# Patient Record
Sex: Female | Born: 1970 | Race: White | Hispanic: No | Marital: Married | Smoking: Never smoker
Health system: Southern US, Community
[De-identification: ages and names within clinical notes are randomized; demographics above are authoritative.]

## PROBLEM LIST (undated history)

## (undated) DIAGNOSIS — N3090 Cystitis, unspecified without hematuria: Secondary | ICD-10-CM

## (undated) DIAGNOSIS — T753XXA Motion sickness, initial encounter: Secondary | ICD-10-CM

## (undated) DIAGNOSIS — R87629 Unspecified abnormal cytological findings in specimens from vagina: Secondary | ICD-10-CM

## (undated) DIAGNOSIS — M1990 Unspecified osteoarthritis, unspecified site: Secondary | ICD-10-CM

## (undated) DIAGNOSIS — I8393 Asymptomatic varicose veins of bilateral lower extremities: Secondary | ICD-10-CM

## (undated) DIAGNOSIS — N809 Endometriosis, unspecified: Secondary | ICD-10-CM

## (undated) DIAGNOSIS — Z8489 Family history of other specified conditions: Secondary | ICD-10-CM

## (undated) HISTORY — DX: Unspecified abnormal cytological findings in specimens from vagina: R87.629

## (undated) HISTORY — DX: Cystitis, unspecified without hematuria: N30.90

## (undated) HISTORY — PX: DILATION AND CURETTAGE OF UTERUS: SHX78

## (undated) HISTORY — DX: Endometriosis, unspecified: N80.9

---

## 2008-05-26 ENCOUNTER — Ambulatory Visit: Payer: Self-pay | Admitting: Unknown Physician Specialty

## 2010-10-02 ENCOUNTER — Ambulatory Visit: Payer: Self-pay | Admitting: Obstetrics & Gynecology

## 2010-10-16 ENCOUNTER — Ambulatory Visit: Payer: Self-pay | Admitting: Obstetrics & Gynecology

## 2010-10-23 ENCOUNTER — Ambulatory Visit: Payer: Self-pay | Admitting: Obstetrics & Gynecology

## 2014-01-21 LAB — HM PAP SMEAR: HM PAP: NEGATIVE

## 2016-03-25 ENCOUNTER — Other Ambulatory Visit: Payer: Self-pay | Admitting: Obstetrics & Gynecology

## 2016-03-25 DIAGNOSIS — Z1231 Encounter for screening mammogram for malignant neoplasm of breast: Secondary | ICD-10-CM

## 2016-04-21 ENCOUNTER — Ambulatory Visit
Admission: RE | Admit: 2016-04-21 | Discharge: 2016-04-21 | Disposition: A | Discharge status: Home / Self Care | Payer: 59 | Source: Ambulatory Visit | Attending: Obstetrics & Gynecology | Admitting: Obstetrics & Gynecology

## 2016-04-21 DIAGNOSIS — Z1231 Encounter for screening mammogram for malignant neoplasm of breast: Secondary | ICD-10-CM | POA: Insufficient documentation

## 2016-04-23 ENCOUNTER — Inpatient Hospital Stay
Admission: RE | Admit: 2016-04-23 | Discharge: 2016-04-23 | Disposition: A | Discharge status: Home / Self Care | Payer: Self-pay | Source: Ambulatory Visit | Attending: *Deleted | Admitting: *Deleted

## 2016-04-23 ENCOUNTER — Other Ambulatory Visit: Payer: Self-pay | Admitting: *Deleted

## 2016-04-23 DIAGNOSIS — Z9289 Personal history of other medical treatment: Secondary | ICD-10-CM

## 2016-05-08 ENCOUNTER — Other Ambulatory Visit: Payer: Self-pay | Admitting: Obstetrics & Gynecology

## 2016-05-08 DIAGNOSIS — R928 Other abnormal and inconclusive findings on diagnostic imaging of breast: Secondary | ICD-10-CM

## 2016-05-08 DIAGNOSIS — N6320 Unspecified lump in the left breast, unspecified quadrant: Secondary | ICD-10-CM

## 2016-05-14 ENCOUNTER — Ambulatory Visit
Admission: RE | Admit: 2016-05-14 | Discharge: 2016-05-14 | Disposition: A | Discharge status: Home / Self Care | Payer: 59 | Source: Ambulatory Visit | Attending: Obstetrics & Gynecology | Admitting: Obstetrics & Gynecology

## 2016-05-14 DIAGNOSIS — R928 Other abnormal and inconclusive findings on diagnostic imaging of breast: Secondary | ICD-10-CM | POA: Diagnosis not present

## 2016-05-14 DIAGNOSIS — N6320 Unspecified lump in the left breast, unspecified quadrant: Secondary | ICD-10-CM

## 2016-05-14 LAB — HM MAMMOGRAPHY

## 2017-03-19 ENCOUNTER — Encounter: Payer: Self-pay | Admitting: Obstetrics & Gynecology

## 2017-03-29 ENCOUNTER — Ambulatory Visit (INDEPENDENT_AMBULATORY_CARE_PROVIDER_SITE_OTHER): Payer: 59 | Admitting: Obstetrics & Gynecology

## 2017-03-29 ENCOUNTER — Encounter: Payer: Self-pay | Admitting: Obstetrics & Gynecology

## 2017-03-29 VITALS — BP 110/60 | HR 55 | Ht 64.0 in | Wt 126.0 lb

## 2017-03-29 DIAGNOSIS — N3010 Interstitial cystitis (chronic) without hematuria: Secondary | ICD-10-CM | POA: Diagnosis not present

## 2017-03-29 DIAGNOSIS — N951 Menopausal and female climacteric states: Secondary | ICD-10-CM | POA: Diagnosis not present

## 2017-03-29 DIAGNOSIS — Z01419 Encounter for gynecological examination (general) (routine) without abnormal findings: Secondary | ICD-10-CM | POA: Diagnosis not present

## 2017-03-29 DIAGNOSIS — Z1231 Encounter for screening mammogram for malignant neoplasm of breast: Secondary | ICD-10-CM

## 2017-03-29 DIAGNOSIS — Z1329 Encounter for screening for other suspected endocrine disorder: Secondary | ICD-10-CM

## 2017-03-29 DIAGNOSIS — Z124 Encounter for screening for malignant neoplasm of cervix: Secondary | ICD-10-CM

## 2017-03-29 DIAGNOSIS — Z1321 Encounter for screening for nutritional disorder: Secondary | ICD-10-CM

## 2017-03-29 DIAGNOSIS — Z803 Family history of malignant neoplasm of breast: Secondary | ICD-10-CM

## 2017-03-29 DIAGNOSIS — Z0000 Encounter for general adult medical examination without abnormal findings: Secondary | ICD-10-CM

## 2017-03-29 DIAGNOSIS — Z131 Encounter for screening for diabetes mellitus: Secondary | ICD-10-CM

## 2017-03-29 DIAGNOSIS — Z13220 Encounter for screening for lipoid disorders: Secondary | ICD-10-CM

## 2017-03-29 DIAGNOSIS — Z1239 Encounter for other screening for malignant neoplasm of breast: Secondary | ICD-10-CM

## 2017-03-29 NOTE — Progress Notes (Signed)
HPI:      [name] is a 46 y.o. [id number] who LMP was Patient's last menstrual period was 03/13/2017., she presents today for her annual examination. The patient has no complaints today. The patient is sexually active. Her last pap: approximate date 2015 and was normal and last mammogram: approximate date 2017 and was normal. The patient does perform self breast exams.  There is notable family history of breast or ovarian cancer in her family.  The patient has regular exercise: yes.  The patient denies current symptoms of depression.   Vag dryness at times; also occas hotflases Concerned about 10 lb weight loss this year Periods reg, light Mood changes- hypersensitivity- worse this year FH Breast Ca Aunts ages 7555 and 5260 (maternal)  GYN History: Contraception: vasectomy  PMHx: Past Medical History:  Diagnosis Date  . Abnormal Pap smear of vagina   . Cystitis   . Endometriosis    Past Surgical History:  Procedure Laterality Date  . DILATION AND CURETTAGE OF UTERUS     Family History  Problem Relation Age of Onset  . Breast cancer Maternal Aunt        x2. late 3960's and early 70's  . Fibromyalgia Mother   . Heart disease Maternal Grandmother   . Heart disease Maternal Grandfather   . Heart disease Paternal Grandmother   . Heart disease Paternal Grandfather   . Brain cancer Maternal Uncle    Social History   Tobacco Use  . Smoking status: Never Smoker  . Smokeless tobacco: Never Used  Substance Use Topics  . Alcohol use: No    Frequency: Never  . Drug use: No    Current Outpatient Medications:  .  aspirin EC 81 MG tablet, Take by mouth., Disp: , Rfl:  .  Multiple Vitamin (MULTI-VITAMINS) TABS, Take by mouth., Disp: , Rfl:  Allergies: Patient has no known allergies.  Review of Systems  Constitutional: Negative for chills, fever and malaise/fatigue.  HENT: Negative for congestion, sinus pain and sore throat.   Eyes: Negative for blurred vision and pain.    Respiratory: Negative for cough and wheezing.   Cardiovascular: Negative for chest pain and leg swelling.  Gastrointestinal: Negative for abdominal pain, constipation, diarrhea, heartburn, nausea and vomiting.  Genitourinary: Negative for dysuria, frequency, hematuria and urgency.  Musculoskeletal: Negative for back pain, joint pain, myalgias and neck pain.  Skin: Negative for itching and rash.  Neurological: Negative for dizziness, tremors and weakness.  Endo/Heme/Allergies: Does not bruise/bleed easily.  Psychiatric/Behavioral: Negative for depression. The patient is not nervous/anxious and does not have insomnia.     Objective: BP 110/60   Pulse (!) 55   Ht 5\' 4"  (1.626 m)   Wt 126 lb (57.2 kg)   LMP 03/13/2017   BMI 21.63 kg/m   Filed Weights   03/29/17 0808  Weight: 126 lb (57.2 kg)   Body mass index is 21.63 kg/m. Physical Exam  Constitutional: She is oriented to person, place, and time. She appears well-developed and well-nourished. No distress.  Genitourinary: Rectum normal, vagina normal and uterus normal. Pelvic exam was performed with patient supine. There is no rash or lesion on the right labia. There is no rash or lesion on the left labia. Vagina exhibits no lesion. No bleeding in the vagina. Right adnexum does not display mass and does not display tenderness. Left adnexum does not display mass and does not display tenderness. Cervix does not exhibit motion tenderness, lesion, friability or polyp.  Uterus is mobile and midaxial. Uterus is not enlarged or exhibiting a mass.  Genitourinary Comments: Multiglobular yet small uterus (fibroids)  HENT:  Head: Normocephalic and atraumatic. Head is without laceration.  Right Ear: Hearing normal.  Left Ear: Hearing normal.  Nose: No epistaxis.  No foreign bodies.  Mouth/Throat: Uvula is midline, oropharynx is clear and moist and mucous membranes are normal.  Eyes: Pupils are equal, round, and reactive to light.  Neck:  Normal range of motion. Neck supple. No thyromegaly present.  Cardiovascular: Normal rate and regular rhythm. Exam reveals no gallop and no friction rub.  No murmur heard. Pulmonary/Chest: Effort normal and breath sounds normal. No respiratory distress. She has no wheezes. Right breast exhibits no mass, no skin change and no tenderness. Left breast exhibits no mass, no skin change and no tenderness.  Abdominal: Soft. Bowel sounds are normal. She exhibits no distension. There is no tenderness. There is no rebound.  Musculoskeletal: Normal range of motion.  Neurological: She is alert and oriented to person, place, and time. No cranial nerve deficit.  Skin: Skin is warm and dry.  Psychiatric: She has a normal mood and affect. Judgment normal.  Vitals reviewed.   Assessment:  ANNUAL EXAM 1. Annual physical exam   2. Screening for cervical cancer   3. Screening for breast cancer   4. IC (interstitial cystitis)   5. Perimenopausal   6. Screening for cholesterol level   7. Screening for diabetes mellitus   8. Screening for thyroid disorder   9. Encounter for vitamin deficiency screening   10. Family history of breast cancer      Screening Plan:            1.  Cervical Screening-  Pap smear done today  2. Breast screening- Exam annually and mammogram>40 planned   3. Colonoscopy every 10 years, Hemoccult testing - after age 48  4. Labs Ordered today  5. Counseling for contraception: vasectomy  Other:  1. Annual physical exam  2. Screening for cervical cancer - IGP, Aptima HPV  3. Screening for breast cancer - MM DIGITAL SCREENING BILATERAL; Future  4. IC (interstitial cystitis) - CBC With Differential  5. Perimenopausal - CBC With Differential - FSH/LH - Estradiol - [facility] Breast and GYN Cancer  6. Screening for cholesterol level - Lipid panel  7. Screening for diabetes mellitus - Glucose, fasting  8. Screening for thyroid disorder - TSH  9. Encounter for  vitamin deficiency screening - VITAMIN D 25 Hydroxy (Vit-D Deficiency, Fractures)  10. Family history of breast cancer - MM DIGITAL SCREENING BILATERAL; Future    F/U  Return in about 1 year (around 03/29/2018) for Annual.  Annamarie MajorPaul Braddock Servellon, MD, Merlinda FrederickFACOG [facility], [facility] 03/29/2017  8:44 AM

## 2017-03-29 NOTE — Patient Instructions (Signed)
PAP every three years Mammogram every year    Call [phone] to [facility]

## 2017-04-01 LAB — IGP, APTIMA HPV
HPV Aptima: NEGATIVE
PAP Smear Comment: 0

## 2017-04-05 ENCOUNTER — Telehealth: Payer: Self-pay

## 2017-04-05 NOTE — Telephone Encounter (Signed)
Please advise if all normal

## 2017-04-05 NOTE — Telephone Encounter (Signed)
Pt inquiring about lab results from 03/29/17 visit w/[facility]. [phone]#[phone]

## 2017-04-06 ENCOUNTER — Encounter: Payer: Self-pay | Admitting: Obstetrics & Gynecology

## 2017-04-06 NOTE — Telephone Encounter (Signed)
Let her know PAP was normal then.

## 2017-04-06 NOTE — Telephone Encounter (Signed)
lmtrc

## 2017-04-14 NOTE — Telephone Encounter (Signed)
[facility] called and stated pt insurance will not cover the cancer testing, pt does not fit criteria, denial reference number is  [id number] [facility] states they will let the pt know also. Call back [phone] [facility] [phone]

## 2017-04-22 NOTE — Telephone Encounter (Signed)
Pt aware.

## 2017-04-22 NOTE — Telephone Encounter (Signed)
Pt returning call about the genetic DNA testing she had through [facility]. She states she got a call from our office. CB# [phone]

## 2017-04-23 LAB — CBC WITH DIFFERENTIAL
BASOS ABS: 0 10*3/uL (ref 0.0–0.2)
BASOS: 1 %
EOS (ABSOLUTE): 0.2 10*3/uL (ref 0.0–0.4)
Eos: 3 %
Hematocrit: 39.8 % (ref 34.0–46.6)
Hemoglobin: 12.5 g/dL (ref 11.1–15.9)
IMMATURE GRANS (ABS): 0 10*3/uL (ref 0.0–0.1)
IMMATURE GRANULOCYTES: 0 %
LYMPHS: 34 %
Lymphocytes Absolute: 1.9 10*3/uL (ref 0.7–3.1)
MCH: 28.2 pg (ref 26.6–33.0)
MCHC: 31.4 g/dL — ABNORMAL LOW (ref 31.5–35.7)
MCV: 90 fL (ref 79–97)
Monocytes Absolute: 0.3 10*3/uL (ref 0.1–0.9)
Monocytes: 6 %
NEUTROS PCT: 56 %
Neutrophils Absolute: 3.1 10*3/uL (ref 1.4–7.0)
RBC: 4.43 x10E6/uL (ref 3.77–5.28)
RDW: 13.5 % (ref 12.3–15.4)
WBC: 5.5 10*3/uL (ref 3.4–10.8)

## 2017-04-23 LAB — LIPID PANEL
CHOLESTEROL TOTAL: 171 mg/dL (ref 100–199)
Chol/HDL Ratio: 2.8 ratio (ref 0.0–4.4)
HDL: 61 mg/dL (ref >39)
LDL Calculated: 94 mg/dL (ref 0–99)
Triglycerides: 79 mg/dL (ref 0–149)
VLDL CHOLESTEROL CAL: 16 mg/dL (ref 5–40)

## 2017-04-23 LAB — VISTASEQ BREAST AND GYN CANCER

## 2017-04-23 LAB — GLUCOSE, FASTING: Glucose, Plasma: 79 mg/dL (ref 65–99)

## 2017-04-23 LAB — FSH/LH
FSH: 5.3 m[IU]/mL
LH: 5.5 m[IU]/mL

## 2017-04-23 LAB — TSH: TSH: 2.33 u[IU]/mL (ref 0.450–4.500)

## 2017-04-23 LAB — ESTRADIOL: ESTRADIOL: 76.1 pg/mL

## 2017-04-23 LAB — VITAMIN D 25 HYDROXY (VIT D DEFICIENCY, FRACTURES): Vit D, 25-Hydroxy: 34.8 ng/mL (ref 30.0–100.0)

## 2017-06-02 ENCOUNTER — Telehealth: Payer: Self-pay | Admitting: Obstetrics & Gynecology

## 2017-06-02 NOTE — Telephone Encounter (Signed)
Called and notified patient to schedule yearly Mammogram

## 2017-06-02 NOTE — Telephone Encounter (Signed)
-----   Message from Nadara Mustardobert P Harris, MD sent at 06/01/2017  3:49 PM EST ----- Patient needs Mammogram.  Encourage to schedule and document in chart.

## 2017-06-10 ENCOUNTER — Other Ambulatory Visit: Payer: Self-pay | Admitting: Obstetrics & Gynecology

## 2017-06-10 DIAGNOSIS — Z1231 Encounter for screening mammogram for malignant neoplasm of breast: Secondary | ICD-10-CM

## 2017-06-23 ENCOUNTER — Ambulatory Visit
Admission: RE | Admit: 2017-06-23 | Discharge: 2017-06-23 | Disposition: A | Discharge status: Home / Self Care | Payer: 59 | Source: Ambulatory Visit | Attending: Obstetrics & Gynecology | Admitting: Obstetrics & Gynecology

## 2017-06-23 DIAGNOSIS — Z1231 Encounter for screening mammogram for malignant neoplasm of breast: Secondary | ICD-10-CM | POA: Diagnosis present

## 2018-04-06 ENCOUNTER — Ambulatory Visit: Payer: Self-pay | Admitting: Obstetrics & Gynecology

## 2018-04-25 ENCOUNTER — Ambulatory Visit (INDEPENDENT_AMBULATORY_CARE_PROVIDER_SITE_OTHER): Payer: 59 | Admitting: Obstetrics & Gynecology

## 2018-04-25 ENCOUNTER — Encounter: Payer: Self-pay | Admitting: Obstetrics & Gynecology

## 2018-04-25 VITALS — BP 100/70 | Ht 64.0 in | Wt 139.0 lb

## 2018-04-25 DIAGNOSIS — H04123 Dry eye syndrome of bilateral lacrimal glands: Secondary | ICD-10-CM

## 2018-04-25 DIAGNOSIS — M25542 Pain in joints of left hand: Secondary | ICD-10-CM

## 2018-04-25 DIAGNOSIS — Z1239 Encounter for other screening for malignant neoplasm of breast: Secondary | ICD-10-CM

## 2018-04-25 DIAGNOSIS — L853 Xerosis cutis: Secondary | ICD-10-CM

## 2018-04-25 DIAGNOSIS — M25541 Pain in joints of right hand: Secondary | ICD-10-CM

## 2018-04-25 DIAGNOSIS — Z01419 Encounter for gynecological examination (general) (routine) without abnormal findings: Secondary | ICD-10-CM | POA: Diagnosis not present

## 2018-04-25 DIAGNOSIS — Z1211 Encounter for screening for malignant neoplasm of colon: Secondary | ICD-10-CM

## 2018-04-25 NOTE — Progress Notes (Signed)
HPI:      [name] is a 48 y.o. [id number] who LMP was Patient's last menstrual period was 04/02/2018., she presents today for her annual examination. The patient has no complaints today, she continues though to have dry skin and vaginal dryness, somewhat dry eyes, and mostly thumb joint pain although sometimes shoulder or elbow. Has been seen for facial rash in past and not found to have Lupus (20 years ago).  Sees dermatology yearly for ezema.   The patient is sexually active. Her last pap: approximate date 2018 and was normal and last mammogram: approximate date 2018 and was normal. The patient does perform self breast exams.  There is notable family history of breast or ovarian cancer in her family.  The patient has regular exercise: yes.  The patient denies current symptoms of depression.    GYN History: Contraception: vasectomy  PMHx: Past Medical History:  Diagnosis Date  . Abnormal Pap smear of vagina   . Cystitis   . Endometriosis    Past Surgical History:  Procedure Laterality Date  . DILATION AND CURETTAGE OF UTERUS     Family History  Problem Relation Age of Onset  . Breast cancer Maternal Aunt        x2. late 59's and early 70's  . Fibromyalgia Mother   . Heart disease Maternal Grandmother   . Heart disease Maternal Grandfather   . Heart disease Paternal Grandmother   . Heart disease Paternal Grandfather   . Brain cancer Maternal Uncle    Social History   Tobacco Use  . Smoking status: Never Smoker  . Smokeless tobacco: Never Used  Substance Use Topics  . Alcohol use: No    Frequency: Never  . Drug use: No    Current Outpatient Medications:  Marland Kitchen  Multiple Vitamin (MULTI-VITAMINS) TABS, Take by mouth., Disp: , Rfl:  .  aspirin EC 81 MG tablet, Take by mouth., Disp: , Rfl:  Allergies: Patient has no known allergies.  Review of Systems  Constitutional: Negative for chills, fever and malaise/fatigue.  HENT: Negative for congestion, sinus pain and  sore throat.   Eyes: Negative for blurred vision and pain.  Respiratory: Negative for cough and wheezing.   Cardiovascular: Negative for chest pain and leg swelling.  Gastrointestinal: Negative for abdominal pain, constipation, diarrhea, heartburn, nausea and vomiting.  Genitourinary: Negative for dysuria, frequency, hematuria and urgency.  Musculoskeletal: Positive for joint pain. Negative for back pain, myalgias and neck pain.  Skin: Negative for itching and rash.  Neurological: Positive for headaches. Negative for dizziness, tremors and weakness.  Endo/Heme/Allergies: Does not bruise/bleed easily.  Psychiatric/Behavioral: Negative for depression. The patient is not nervous/anxious and does not have insomnia.     Objective: BP 100/70   Ht 5\' 4"  (1.626 m)   Wt 139 lb (63 kg)   LMP 04/02/2018   BMI 23.86 kg/m   Filed Weights   04/25/18 0820  Weight: 139 lb (63 kg)   Body mass index is 23.86 kg/m. Physical Exam Constitutional:      General: She is not in acute distress.    Appearance: She is well-developed.  Genitourinary:     Pelvic exam was performed with patient supine.     Vagina, uterus and rectum normal.     No lesions in the vagina.     No vaginal bleeding.     No cervical motion tenderness, friability, lesion or polyp.     Uterus is mobile.  Uterus is not enlarged.     No uterine mass detected.    Uterus is midaxial.     No right or left adnexal mass present.     Right adnexa not tender.     Left adnexa not tender.     Genitourinary Comments: Mild T  HENT:     Head: Normocephalic and atraumatic. No laceration.     Right Ear: Hearing normal.     Left Ear: Hearing normal.     Mouth/Throat:     Pharynx: Uvula midline.  Eyes:     Pupils: Pupils are equal, round, and reactive to light.  Neck:     Musculoskeletal: Normal range of motion and neck supple.     Thyroid: No thyromegaly.  Cardiovascular:     Rate and Rhythm: Normal rate and regular rhythm.      Heart sounds: No murmur. No friction rub. No gallop.   Pulmonary:     Effort: Pulmonary effort is normal. No respiratory distress.     Breath sounds: Normal breath sounds. No wheezing.  Chest:     Breasts:        Right: No mass, skin change or tenderness.        Left: No mass, skin change or tenderness.  Abdominal:     General: Bowel sounds are normal. There is no distension.     Palpations: Abdomen is soft.     Tenderness: There is no abdominal tenderness. There is no rebound.  Musculoskeletal: Normal range of motion.  Neurological:     Mental Status: She is alert and oriented to person, place, and time.     Cranial Nerves: No cranial nerve deficit.  Skin:    General: Skin is warm and dry.  Psychiatric:        Judgment: Judgment normal.  Vitals signs reviewed.     Assessment:  ANNUAL EXAM 1. Women's annual routine gynecological examination   2. Screening for breast cancer   3. Screen for colon cancer   4. Dry skin   5. Dry eyes   6. Arthralgia of both hands      Screening Plan:            1.  Cervical Screening-  Pap smear schedule reviewed with patient  2. Breast screening- Exam annually and mammogram>40 planned   3. Colonoscopy every 10 years- plan this SUMMER, Hemoccult testing - after age 10  4. Labs UTD w screening labs last year.  Labs for SLE and SS due to sx's today, referral if positive.  Pt to see Dermatology as well for dry skin concerns.  5. Counseling for contraception: vasectomy   6. Dry skin - Sjogrens syndrome-A extractable nuclear antibody - Sjogrens syndrome-B extractable nuclear antibody - Extractable Nuclear antigen ab - Jo-1 antibody-IgG  7. Dry eyes - Sjogrens syndrome-A extractable nuclear antibody - Sjogrens syndrome-B extractable nuclear antibody - Extractable Nuclear antigen ab - Jo-1 antibody-IgG  8. Arthralgia of both hands - Sjogrens syndrome-A extractable nuclear antibody - Sjogrens syndrome-B extractable nuclear antibody -  Extractable Nuclear antigen ab - Jo-1 antibody-IgG    F/U  Return in about 1 year (around 04/26/2019) for Annual.  Annamarie Major, MD, [facility] Frederick [facility], [facility] 04/25/2018  8:54 AM

## 2018-04-25 NOTE — Patient Instructions (Signed)
PAP every three years Mammogram every year    Call [phone] to schedule at [facility]oscopy every 10 years Labs yearly (with PCP) 

## 2018-04-26 LAB — EXTRACTABLE NUCLEAR ANTIGEN ANTIBODY
ENA RNP Ab: 0.2 AI (ref 0.0–0.9)
ENA SM AB SER-ACNC: 0.2 AI (ref 0.0–0.9)
ENA SSA (RO) Ab: <0.2 AI (ref 0.0–0.9)
ENA SSB (LA) Ab: <0.2 AI (ref 0.0–0.9)
Scleroderma SCL-70: <0.2 AI (ref 0.0–0.9)
dsDNA Ab: 2 IU/mL (ref 0–9)

## 2018-05-05 ENCOUNTER — Telehealth: Payer: Self-pay | Admitting: Gastroenterology

## 2018-05-05 NOTE — Telephone Encounter (Signed)
Returned patients call to schedule colonoscopy.  LVM for her to call office.  Thanks [employer]

## 2018-05-05 NOTE — Telephone Encounter (Signed)
Patient returning Michelle's call to schedule a colonoscopy. °

## 2018-05-05 NOTE — Telephone Encounter (Signed)
Pt left vm she is returning a call for scheduling a procedure

## 2018-05-06 ENCOUNTER — Other Ambulatory Visit: Payer: Self-pay

## 2018-05-06 DIAGNOSIS — Z1211 Encounter for screening for malignant neoplasm of colon: Secondary | ICD-10-CM

## 2018-05-06 NOTE — Telephone Encounter (Signed)
Patients call has been returned.  Colonoscopy has been scheduled for 09/26/18 at [facility] with Dr. Allegra Lai.  Thanks [employer]

## 2018-05-09 ENCOUNTER — Encounter: Payer: Self-pay | Admitting: *Deleted

## 2018-06-23 ENCOUNTER — Other Ambulatory Visit: Payer: Self-pay | Admitting: Obstetrics & Gynecology

## 2018-06-23 DIAGNOSIS — Z1231 Encounter for screening mammogram for malignant neoplasm of breast: Secondary | ICD-10-CM

## 2018-09-15 ENCOUNTER — Other Ambulatory Visit: Payer: Self-pay

## 2018-09-15 DIAGNOSIS — Z1211 Encounter for screening for malignant neoplasm of colon: Secondary | ICD-10-CM

## 2018-09-22 ENCOUNTER — Other Ambulatory Visit
Admission: RE | Admit: 2018-09-22 | Discharge: 2018-09-22 | Disposition: A | Discharge status: Home / Self Care | Payer: 59 | Source: Ambulatory Visit | Attending: Gastroenterology | Admitting: Gastroenterology

## 2018-09-22 ENCOUNTER — Other Ambulatory Visit: Payer: Self-pay

## 2018-09-22 DIAGNOSIS — Z1159 Encounter for screening for other viral diseases: Secondary | ICD-10-CM | POA: Insufficient documentation

## 2018-09-23 LAB — NOVEL CORONAVIRUS, NAA (HOSP ORDER, SEND-OUT TO REF LAB; TAT 18-24 HRS): SARS-CoV-2, NAA: NOT DETECTED

## 2018-09-23 NOTE — Discharge Instructions (Signed)
General Anesthesia, Adult, Care After  This sheet gives you information about how to care for yourself after your procedure. Your health care provider may also give you more specific instructions. If you have problems or questions, contact your health care provider.  What can I expect after the procedure?  After the procedure, the following side effects are common:  Pain or discomfort at the IV site.  Nausea.  Vomiting.  Sore throat.  Trouble concentrating.  Feeling cold or chills.  Weak or tired.  Sleepiness and fatigue.  Soreness and body aches. These side effects can affect parts of the body that were not involved in surgery.  Follow these instructions at home:    For at least 24 hours after the procedure:  Have a responsible adult stay with you. It is important to have someone help care for you until you are awake and alert.  Rest as needed.  Do not:  Participate in activities in which you could fall or become injured.  Drive.  Use heavy machinery.  Drink alcohol.  Take sleeping pills or medicines that cause drowsiness.  Make important decisions or sign legal documents.  Take care of children on your own.  Eating and drinking  Follow any instructions from your health care provider about eating or drinking restrictions.  When you feel hungry, start by eating small amounts of foods that are soft and easy to digest (bland), such as toast. Gradually return to your regular diet.  Drink enough fluid to keep your urine pale yellow.  If you vomit, rehydrate by drinking water, juice, or clear broth.  General instructions  If you have sleep apnea, surgery and certain medicines can increase your risk for breathing problems. Follow instructions from your health care provider about wearing your sleep device:  Anytime you are sleeping, including during daytime naps.  While taking prescription pain medicines, sleeping medicines, or medicines that make you drowsy.  Return to your normal activities as told by your health care  provider. Ask your health care provider what activities are safe for you.  Take over-the-counter and prescription medicines only as told by your health care provider.  If you smoke, do not smoke without supervision.  Keep all follow-up visits as told by your health care provider. This is important.  Contact a health care provider if:  You have nausea or vomiting that does not get better with medicine.  You cannot eat or drink without vomiting.  You have pain that does not get better with medicine.  You are unable to pass urine.  You develop a skin rash.  You have a fever.  You have redness around your IV site that gets worse.  Get help right away if:  You have difficulty breathing.  You have chest pain.  You have blood in your urine or stool, or you vomit blood.  Summary  After the procedure, it is common to have a sore throat or nausea. It is also common to feel tired.  Have a responsible adult stay with you for the first 24 hours after general anesthesia. It is important to have someone help care for you until you are awake and alert.  When you feel hungry, start by eating small amounts of foods that are soft and easy to digest (bland), such as toast. Gradually return to your regular diet.  Drink enough fluid to keep your urine pale yellow.  Return to your normal activities as told by your health care provider. Ask your health care   provider what activities are safe for you.  This information is not intended to replace advice given to you by your health care provider. Make sure you discuss any questions you have with your health care provider.  Document Released: 06/22/2000 Document Revised: 10/30/2016 Document Reviewed: 10/30/2016  Elsevier Interactive Patient Education  2019 [employer] Inc[employer]

## 2018-09-26 ENCOUNTER — Encounter: Admission: RE | Payer: Self-pay | Source: Home / Self Care

## 2018-09-26 ENCOUNTER — Ambulatory Visit
Admission: RE | Admit: 2018-09-26 | Discharge: 2018-09-26 | Disposition: A | Discharge status: Home / Self Care | Payer: 59 | Attending: Gastroenterology | Admitting: Gastroenterology

## 2018-09-26 ENCOUNTER — Ambulatory Visit: Payer: 59 | Admitting: Anesthesiology

## 2018-09-26 ENCOUNTER — Encounter
Admission: RE | Disposition: A | Discharge status: Home / Self Care | Payer: Self-pay | Source: Home / Self Care | Attending: Gastroenterology

## 2018-09-26 ENCOUNTER — Ambulatory Visit: Admission: RE | Admit: 2018-09-26 | Payer: 59 | Source: Home / Self Care | Admitting: Gastroenterology

## 2018-09-26 DIAGNOSIS — Z7982 Long term (current) use of aspirin: Secondary | ICD-10-CM | POA: Diagnosis not present

## 2018-09-26 DIAGNOSIS — Z1211 Encounter for screening for malignant neoplasm of colon: Secondary | ICD-10-CM

## 2018-09-26 DIAGNOSIS — K641 Second degree hemorrhoids: Secondary | ICD-10-CM | POA: Insufficient documentation

## 2018-09-26 HISTORY — DX: Unspecified osteoarthritis, unspecified site: M19.90

## 2018-09-26 HISTORY — DX: Motion sickness, initial encounter: T75.3XXA

## 2018-09-26 HISTORY — DX: Family history of other specified conditions: Z84.89

## 2018-09-26 HISTORY — DX: Asymptomatic varicose veins of bilateral lower extremities: I83.93

## 2018-09-26 HISTORY — PX: COLONOSCOPY WITH PROPOFOL: SHX5780

## 2018-09-26 LAB — POCT PREGNANCY, URINE: Preg Test, Ur: NEGATIVE

## 2018-09-26 SURGERY — COLONOSCOPY WITH PROPOFOL
Anesthesia: General

## 2018-09-26 MED ORDER — SODIUM CHLORIDE 0.9 % IV SOLN: INTRAVENOUS | Status: DC

## 2018-09-26 MED ORDER — STERILE WATER FOR IRRIGATION IR SOLN
Status: DC | PRN
  Administered 2018-09-26: .3 mL

## 2018-09-26 MED ORDER — ONDANSETRON HCL 4 MG/2ML IJ SOLN: 4.0000 mg | Freq: Once | INTRAMUSCULAR | Status: DC | PRN

## 2018-09-26 MED ORDER — PROPOFOL 10 MG/ML IV BOLUS
INTRAVENOUS | Status: DC | PRN
  Administered 2018-09-26: 80 mg via INTRAVENOUS
  Administered 2018-09-26 (×4): 40 mg via INTRAVENOUS

## 2018-09-26 MED ORDER — LACTATED RINGERS IV SOLN
INTRAVENOUS | Status: DC
  Administered 2018-09-26: 08:00:00 via INTRAVENOUS

## 2018-09-26 MED ORDER — LIDOCAINE HCL (CARDIAC) PF 100 MG/5ML IV SOSY
PREFILLED_SYRINGE | INTRAVENOUS | Status: DC | PRN
  Administered 2018-09-26: 30 mg via INTRAVENOUS

## 2018-09-26 MED ORDER — GLYCOPYRROLATE 0.2 MG/ML IJ SOLN
INTRAMUSCULAR | Status: DC | PRN
  Administered 2018-09-26: 0.1 mg via INTRAVENOUS

## 2018-09-26 SURGICAL SUPPLY — 24 items

## 2018-09-26 NOTE — Transfer of Care (Signed)
Immediate Anesthesia Transfer of Care Note  Patient: Heather Haney  Procedure(s) Performed: COLONOSCOPY WITH PROPOFOL (N/A )  Patient Location: PACU  Anesthesia Type: General  Level of Consciousness: awake, alert  and patient cooperative  Airway and Oxygen Therapy: Patient Spontanous Breathing and Patient connected to supplemental oxygen  Post-op Assessment: Post-op Vital signs reviewed, Patient's Cardiovascular Status Stable, Respiratory Function Stable, Patent Airway and No signs of Nausea or vomiting  Post-op Vital Signs: Reviewed and stable  Complications: No apparent anesthesia complications

## 2018-09-26 NOTE — H&P (Signed)
Heather Haney Heather Seres, MD [facility] [address] [place], [place] [address] Phone: [phone] Fax : [phone]  Primary Care Physician:  Patient, No Pcp Per Primary Gastroenterologist:  Dr. Servando SnareWohl  Pre-Procedure History & Physical: HPI:  Heather IsaacsMelanie Capes Haney is a 48 y.o. female is here for a screening colonoscopy.   Past Medical History:  Diagnosis Date  . Abnormal Pap smear of vagina   . Arthritis    neck and back  . Car sickness   . Cystitis   . Endometriosis   . Family history of adverse reaction to anesthesia    mom - N/V  . Varicose veins of both lower extremities     Past Surgical History:  Procedure Laterality Date  . DILATION AND CURETTAGE OF UTERUS      Prior to Admission medications   Medication Sig Start Date End Date Taking? Authorizing Provider  calcium carbonate (OS-CAL) 1250 (500 Ca) MG chewable tablet Chew 1 tablet by mouth daily.   Yes [provider]  Multiple Vitamin (MULTI-VITAMINS) TABS Take by mouth.   Yes [provider]  Omega-3 Fatty Acids (FISH OIL) 1000 MG CAPS Take 1,000 mg by mouth.   Yes [provider]  vitamin B-12 (CYANOCOBALAMIN) 1000 MCG tablet Take 1,000 mcg by mouth daily.   Yes [provider]  aspirin EC 81 MG tablet Take by mouth.    [provider]    Allergies as of 09/15/2018  . (No Known Allergies)    Family History  Problem Relation Age of Onset  . Breast cancer Maternal Aunt        x2. late 2260's and early 70's  . Fibromyalgia Mother   . Heart disease Maternal Grandmother   . Heart disease Maternal Grandfather   . Heart disease Paternal Grandmother   . Heart disease Paternal Grandfather   . Brain cancer Maternal Uncle     Social History   Socioeconomic History  . Marital status: Married    Spouse name: Not on file  . Number of children: Not on file  . Years of education: Not on file  . Highest education level: Not on file  Occupational History  . Not on file  Social  Needs  . Financial resource strain: Not on file  . Food insecurity    Worry: Not on file    Inability: Not on file  . Transportation needs    Medical: Not on file    Non-medical: Not on file  Tobacco Use  . Smoking status: Never Smoker  . Smokeless tobacco: Never Used  Substance and Sexual Activity  . Alcohol use: No    Frequency: Never  . Drug use: No  . Sexual activity: Yes    Birth control/protection: None  Lifestyle  . Physical activity    Days per week: Not on file    Minutes per session: Not on file  . Stress: Not on file  Relationships  . Social Musicianconnections    Talks on phone: Not on file    Gets together: Not on file    Attends religious service: Not on file    Active member of club or organization: Not on file    Attends meetings of clubs or organizations: Not on file    Relationship status: Not on file  . Intimate partner violence    Fear of current or ex partner: Not on file    Emotionally abused: Not on file    Physically abused: Not on file  Forced sexual activity: Not on file  Other Topics Concern  . Not on file  Social History Narrative  . Not on file    Review of Systems: See HPI, otherwise negative ROS  Physical Exam: BP 126/80   Pulse 79   Temp 99 F (37.2 C) (Temporal)   Resp 16   Ht 5\' 4"  (1.626 m)   Wt 60.3 kg   SpO2 100%   BMI 22.83 kg/m  General:   Alert,  pleasant and cooperative in NAD Head:  Normocephalic and atraumatic. Neck:  Supple; no masses or thyromegaly. Lungs:  Clear throughout to auscultation.    Heart:  Regular rate and rhythm. Abdomen:  Soft, nontender and nondistended. Normal bowel sounds, without guarding, and without rebound.   Neurologic:  Alert and  oriented x4;  grossly normal neurologically.  Impression/Plan: Heather Haney is now here to undergo a screening colonoscopy.  Risks, benefits, and alternatives regarding colonoscopy have been reviewed with the patient.  Questions have been answered.  All  parties agreeable.

## 2018-09-26 NOTE — Anesthesia Postprocedure Evaluation (Signed)
Anesthesia Post Note  Patient: Heather Haney  Procedure(s) Performed: COLONOSCOPY WITH PROPOFOL (N/A )  Patient location during evaluation: PACU Anesthesia Type: General Level of consciousness: awake Pain management: pain level controlled Vital Signs Assessment: post-procedure vital signs reviewed and stable Respiratory status: respiratory function stable Cardiovascular status: stable Postop Assessment: no signs of nausea or vomiting Anesthetic complications: no    Veda Canning

## 2018-09-26 NOTE — Anesthesia Procedure Notes (Signed)
Date/Time: 09/26/2018 8:29 AM Performed by: Cameron Ali, CRNA Pre-anesthesia Checklist: Patient identified, Emergency Drugs available, Suction available, Timeout performed and Patient being monitored Patient Re-evaluated:Patient Re-evaluated prior to induction Oxygen Delivery Method: Nasal cannula Placement Confirmation: positive ETCO2

## 2018-09-26 NOTE — Op Note (Signed)
[facility] Gastroenterology Patient Name: Heather RuaMelanie Haney Procedure Date: 09/26/2018 8:27 AM MRN: [id number] Account #: [id number] Date of Birth: 05/13/1970 Admit Type: Outpatient Age: [age] [facility] Gender: Female Note Status: Finalized Procedure:            Colonoscopy Indications:          Screening for colorectal malignant neoplasm Providers:            Midge Miniumarren Nealy Hickmon MD, MD Medicines:            Propofol per Anesthesia Complications:        No immediate complications. Procedure:            Pre-Anesthesia Assessment:                       - Prior to the procedure, a History and Physical was                        performed, and patient medications and allergies were                        reviewed. The patient's tolerance of previous                        anesthesia was also reviewed. The risks and benefits of                        the procedure and the sedation options and risks were                        discussed with the patient. All questions were                        answered, and informed consent was obtained. Prior                        Anticoagulants: The patient has taken no previous                        anticoagulant or antiplatelet agents. ASA Grade                        Assessment: II - A patient with mild systemic disease.                        After reviewing the risks and benefits, the patient was                        deemed in satisfactory condition to undergo the                        procedure.                       After obtaining informed consent, the colonoscope was                        passed under direct vision. Throughout the procedure,                        the patient's blood pressure,  pulse, and oxygen                        saturations were monitored continuously. The was                        introduced through the anus and advanced to the the                        cecum, identified by appendiceal orifice  and ileocecal                        valve. The colonoscopy was performed without                        difficulty. The patient tolerated the procedure well.                        The quality of the bowel preparation was excellent. Findings:      The perianal and digital rectal examinations were normal.      Non-bleeding internal hemorrhoids were found during retroflexion. The       hemorrhoids were Grade II (internal hemorrhoids that prolapse but reduce       spontaneously). Impression:           - Non-bleeding internal hemorrhoids.                       - No specimens collected. Recommendation:       - Discharge patient to home.                       - Resume previous diet.                       - Continue present medications.                       - Repeat colonoscopy in 10 years for screening unless                        any change in family history or lower GI problems. Procedure Code(s):    --- Professional ---                       [phone], Colonoscopy, flexible; diagnostic, including                        collection of specimen(s) by brushing or washing, when                        performed (separate procedure) Diagnosis Code(s):    --- Professional ---                       Z12.11, Encounter for screening for malignant neoplasm                        of colon CPT copyright 2019 American Medical Association. All rights reserved. The codes documented in this report are preliminary and upon coder review may  be revised to meet current compliance requirements. Lucilla Lame MD, MD 09/26/2018 8:52:20 AM This report has been signed electronically.  Number of Addenda: 0 Note Initiated On: 09/26/2018 8:27 AM Scope Withdrawal Time: 0 hours 6 minutes 23 seconds  Total Procedure Duration: 0 hours 10 minutes 22 seconds  Estimated Blood Loss: Estimated blood loss: none.      [facility]

## 2018-09-26 NOTE — Anesthesia Preprocedure Evaluation (Signed)
Anesthesia Evaluation  Patient identified by MRN, date of birth, ID band Patient awake    Reviewed: Allergy & Precautions, NPO status , Patient's Chart, lab work & pertinent test results  Airway Mallampati: II  TM Distance: >3 FB     Dental   Pulmonary neg pulmonary ROS,    breath sounds clear to auscultation       Cardiovascular negative cardio ROS   Rhythm:Regular Rate:Normal     Neuro/Psych    GI/Hepatic negative GI ROS,   Endo/Other  endometriosis  Renal/GU Interstitial cystitis     Musculoskeletal  (+) Arthritis ,   Abdominal   Peds  Hematology   Anesthesia Other Findings   Reproductive/Obstetrics                             Anesthesia Physical Anesthesia Plan  ASA: II  Anesthesia Plan: General   Post-op Pain Management:    Induction: Intravenous  PONV Risk Score and Plan:   Airway Management Planned: Nasal Cannula and Natural Airway  Additional Equipment:   Intra-op Plan:   Post-operative Plan:   Informed Consent: I have reviewed the patients History and Physical, chart, labs and discussed the procedure including the risks, benefits and alternatives for the proposed anesthesia with the patient or authorized representative who has indicated his/her understanding and acceptance.       Plan Discussed with: CRNA  Anesthesia Plan Comments:         Anesthesia Quick Evaluation

## 2018-09-27 ENCOUNTER — Encounter: Payer: Self-pay | Admitting: Gastroenterology

## 2018-10-25 ENCOUNTER — Other Ambulatory Visit: Payer: Self-pay

## 2018-10-25 ENCOUNTER — Ambulatory Visit
Admission: RE | Admit: 2018-10-25 | Discharge: 2018-10-25 | Disposition: A | Discharge status: Home / Self Care | Payer: 59 | Source: Ambulatory Visit | Attending: Obstetrics & Gynecology | Admitting: Obstetrics & Gynecology

## 2018-10-25 DIAGNOSIS — Z1231 Encounter for screening mammogram for malignant neoplasm of breast: Secondary | ICD-10-CM | POA: Diagnosis present

## 2019-10-11 ENCOUNTER — Ambulatory Visit (INDEPENDENT_AMBULATORY_CARE_PROVIDER_SITE_OTHER): Payer: 59 | Admitting: Obstetrics & Gynecology

## 2019-10-11 ENCOUNTER — Other Ambulatory Visit: Payer: Self-pay

## 2019-10-11 ENCOUNTER — Other Ambulatory Visit (HOSPITAL_COMMUNITY)
Admission: RE | Admit: 2019-10-11 | Discharge: 2019-10-11 | Disposition: A | Discharge status: Home / Self Care | Payer: 59 | Source: Ambulatory Visit | Attending: Obstetrics & Gynecology | Admitting: Obstetrics & Gynecology

## 2019-10-11 ENCOUNTER — Encounter: Payer: Self-pay | Admitting: Obstetrics & Gynecology

## 2019-10-11 VITALS — BP 100/70 | Ht 64.0 in | Wt 137.0 lb

## 2019-10-11 DIAGNOSIS — Z124 Encounter for screening for malignant neoplasm of cervix: Secondary | ICD-10-CM

## 2019-10-11 DIAGNOSIS — Z01419 Encounter for gynecological examination (general) (routine) without abnormal findings: Secondary | ICD-10-CM

## 2019-10-11 DIAGNOSIS — Z1211 Encounter for screening for malignant neoplasm of colon: Secondary | ICD-10-CM

## 2019-10-11 DIAGNOSIS — R5383 Other fatigue: Secondary | ICD-10-CM

## 2019-10-11 DIAGNOSIS — Z13220 Encounter for screening for lipoid disorders: Secondary | ICD-10-CM

## 2019-10-11 DIAGNOSIS — Z1321 Encounter for screening for nutritional disorder: Secondary | ICD-10-CM

## 2019-10-11 DIAGNOSIS — Z131 Encounter for screening for diabetes mellitus: Secondary | ICD-10-CM

## 2019-10-11 DIAGNOSIS — Z1231 Encounter for screening mammogram for malignant neoplasm of breast: Secondary | ICD-10-CM

## 2019-10-11 NOTE — Progress Notes (Signed)
HPI:      [name] is a 49 y.o. [id number] who LMP was in the past, she presents today for her annual examination.  The patient has no complaints today OTHER THAN VAGINAL DRYNESS AND AT TIMES DYSPAREUNIA; ALSO FATIGUE NOTED FREQUENTLY, AFFECTING WORK AND EVENING ABILITY TO BE ACTIVE. The patient is sexually active. Herlast pap: approximate date 2018 and was normal and last mammogram: approximate date 2020 and was normal.  The patient does perform self breast exams.  There is notable family history of breast or ovarian cancer in her family. The patient is not taking hormone replacement therapy. Patient denies post-menopausal vaginal bleeding.   The patient has regular exercise: yes. The patient denies current symptoms of depression.    GYN Hx: Last Colonoscopy:1 year ago. Normal.  Last DEXA: never ago.    PMHx: Past Medical History:  Diagnosis Date  . Abnormal Pap smear of vagina   . Arthritis    neck and back  . Car sickness   . Cystitis   . Endometriosis   . Family history of adverse reaction to anesthesia    mom - N/V  . Varicose veins of both lower extremities    Past Surgical History:  Procedure Laterality Date  . COLONOSCOPY WITH PROPOFOL N/A 09/26/2018   Procedure: COLONOSCOPY WITH PROPOFOL;  Surgeon: Midge Minium, MD;  Location: [facility];  Service: Endoscopy;  Laterality: N/A;  . DILATION AND CURETTAGE OF UTERUS     Family History  Problem Relation Age of Onset  . Breast cancer Maternal Aunt        x2. late 47's and early 70's  . Fibromyalgia Mother   . Heart disease Maternal Grandmother   . Heart disease Maternal Grandfather   . Heart disease Paternal Grandmother   . Heart disease Paternal Grandfather   . Brain cancer Maternal Uncle    Social History   Tobacco Use  . Smoking status: Never Smoker  . Smokeless tobacco: Never Used  Vaping Use  . Vaping Use: Never used  Substance Use Topics  . Alcohol use: No  . Drug use: No    Current  Outpatient Medications:  .  calcium carbonate (OS-CAL) 1250 (500 Ca) MG chewable tablet, Chew 1 tablet by mouth daily., Disp: , Rfl:  .  Multiple Vitamin (MULTI-VITAMINS) TABS, Take by mouth., Disp: , Rfl:  .  Omega-3 Fatty Acids (FISH OIL) 1000 MG CAPS, Take 1,000 mg by mouth., Disp: , Rfl:  .  vitamin B-12 (CYANOCOBALAMIN) 1000 MCG tablet, Take 1,000 mcg by mouth daily., Disp: , Rfl:  Allergies: Patient has no known allergies.  Review of Systems  Constitutional: Positive for malaise/fatigue. Negative for chills and fever.  HENT: Negative for congestion, sinus pain and sore throat.   Eyes: Negative for blurred vision and pain.  Respiratory: Negative for cough and wheezing.   Cardiovascular: Negative for chest pain and leg swelling.  Gastrointestinal: Negative for abdominal pain, constipation, diarrhea, heartburn, nausea and vomiting.  Genitourinary: Negative for dysuria, frequency, hematuria and urgency.  Musculoskeletal: Negative for back pain, joint pain, myalgias and neck pain.  Skin: Negative for itching and rash.  Neurological: Negative for dizziness, tremors and weakness.  Endo/Heme/Allergies: Does not bruise/bleed easily.  Psychiatric/Behavioral: Negative for depression. The patient is not nervous/anxious and does not have insomnia.     Objective: BP 100/70   Ht 5\' 4"  (1.626 m)   Wt 137 lb (62.1 kg)   BMI 23.52 kg/m    10/11/19 1333  Weight: 137 lb (62.1 kg)   Body mass index is 23.52 kg/m. Physical Exam Constitutional:      General: She is not in acute distress.    Appearance: She is well-developed.  Genitourinary:     Pelvic exam was performed with patient supine.     Vagina, uterus and rectum normal.     No lesions in the vagina.     No vaginal bleeding.     No cervical motion tenderness, friability, lesion or polyp.     Uterus is mobile.     Uterus is not enlarged.     No uterine mass detected.    Uterus is midaxial.     No right or left  adnexal mass present.     Right adnexa not tender.     Left adnexa not tender.  HENT:     Head: Normocephalic and atraumatic. No laceration.     Right Ear: Hearing normal.     Left Ear: Hearing normal.     Mouth/Throat:     Pharynx: Uvula midline.  Eyes:     Pupils: Pupils are equal, round, and reactive to light.  Neck:     Thyroid: No thyromegaly.  Cardiovascular:     Rate and Rhythm: Normal rate and regular rhythm.     Heart sounds: No murmur heard.  No friction rub. No gallop.   Pulmonary:     Effort: Pulmonary effort is normal. No respiratory distress.     Breath sounds: Normal breath sounds. No wheezing.  Chest:     Breasts:        Right: No mass, skin change or tenderness.        Left: No mass, skin change or tenderness.  Abdominal:     General: Bowel sounds are normal. There is no distension.     Palpations: Abdomen is soft.     Tenderness: There is no abdominal tenderness. There is no rebound.  Musculoskeletal:        General: Normal range of motion.     Cervical back: Normal range of motion and neck supple.  Neurological:     Mental Status: She is alert and oriented to person, place, and time.     Cranial Nerves: No cranial nerve deficit.  Skin:    General: Skin is warm and dry.  Psychiatric:        Judgment: Judgment normal.  Vitals reviewed.     Assessment: Annual Exam 1. Women's annual routine gynecological examination   2. Encounter for screening mammogram for malignant neoplasm of breast   3. Screening for cervical cancer   4. Screening for cholesterol level   5. Screening for diabetes mellitus   6. Encounter for vitamin deficiency screening   7. Fatigue, unspecified type   8. Screen for colon cancer     Plan:            1.  Cervical Screening-  Pap smear done today  2. Breast screening- Exam annually and mammogram scheduled  3. Colonoscopy every 10 years, Hemoccult testing after age 9  4. Labs To return fasting at a later date  5.  Counseling for hormonal therapy: none Info on Replens and alternatives discussed for vag dryness and dyspareunia    F/U  Return in about 1 year (around 10/10/2020) for Annual.  Annamarie Major, MD, [facility], [facility] 10/11/2019  2:16 PM

## 2019-10-11 NOTE — Patient Instructions (Addendum)
PAP every three years Mammogram every year    Call [phone] to schedule at [facility] Colonoscopy every 10 years Labs yearly (with PCP)  Thank you for choosing [facility]. As part of our ongoing efforts to improve patient experience, we would appreciate your feedback. Please fill out the short survey that you will receive by mail or MyChart. Your opinion is important to [place]!  Primary Care in the area  This is not meant to be comprehensive list, but a resource for some providers that you can call for counseling or medication needs. If you have a recommendation, please let us know.   [facility]:  [phone]               Dr Julieanne Manson               Dr Jamse Belfast               Dr Shirlee Latch  [facility]:  [phone]  Dr Danna Hefty [facility], [place]:   [phone]  Hannah Beat, MD  Gweneth Dimitri, MD  Eustaquio Boyden, MD  Excell Seltzer, MD  [facility], [place]: [phone]  Quentin Ore, MD    Atrophic Vaginitis or Dryness  Atrophic vaginitis is a condition in which the tissues that line the vagina become dry and thin. This condition is most common in women who have stopped having regular menstrual periods (are in menopause). This usually starts when a woman is 51-31 years old. That is the time when a woman's estrogen levels begin to drop (decrease). Estrogen is a female hormone. It helps to keep the tissues of the vagina moist. It stimulates the vagina to produce a clear fluid that lubricates the vagina for sexual intercourse. This fluid also protects the vagina from infection. Lack of estrogen can cause the lining of the vagina to get thinner and dryer. The vagina may also shrink in size. It may become less elastic. Atrophic vaginitis tends to get worse over time as a woman's estrogen level drops. What are the causes? This condition is caused by the normal drop in estrogen  that happens around the time of menopause. What increases the risk? Certain conditions or situations may lower a woman's estrogen level, leading to a higher risk for atrophic vaginitis. You are more likely to develop this condition if:  You are taking medicines that block estrogen.  You have had your ovaries removed.  You are being treated for cancer with X-ray (radiation) or medicines (chemotherapy).  You have given birth or are breastfeeding.  You are older than age 51.  You smoke. What are the signs or symptoms? Symptoms of this condition include:  Pain, soreness, or bleeding during sexual intercourse (dyspareunia).  Vaginal burning, irritation, or itching.  Pain or bleeding when a speculum is used in a vaginal exam (pelvic exam).  Having burning pain when passing urine.  Vaginal discharge that is brown or yellow. In some cases, there are no symptoms. How is this diagnosed? This condition is diagnosed by taking a medical history and doing a physical exam. This will include a pelvic exam that checks the vaginal tissues. Though rare, you may also have other tests, including:  A urine test.  A test that checks the acid balance in your vagina (acid balance test). How is this treated? Treatment for this condition depends on how severe your symptoms are. Treatment may include:  Using an over-the-counter  vaginal lubricant before sex.  Using a long-acting vaginal moisturizer. - REPLENS -  Medicines such as OSPHENA and INTRAROSA  Using low-dose vaginal estrogen for moderate to severe symptoms that do not respond to other treatments. Options include creams, tablets, and inserts (vaginal rings). Before you use a vaginal estrogen, tell your health care provider if you have a history of: ? Breast cancer. ? Endometrial cancer. ? Blood clots. If you are not sexually active and your symptoms are very mild, you may not need treatment. Follow these instructions at  home: Medicines  Take over-the-counter and prescription medicines only as told by your health care provider. Do not use herbal or alternative medicines unless your health care provider says that you can.  Use over-the-counter creams, lubricants, or moisturizers for dryness only as directed by your health care provider. General instructions  If your atrophic vaginitis is caused by menopause, discuss all of your menopause symptoms and treatment options with your health care provider.  Do not douche.  Do not use products that can make your vagina dry. These include: ? Scented feminine sprays. ? Scented tampons. ? Scented soaps.  Vaginal intercourse can help to improve blood flow and elasticity of vaginal tissue. If it hurts to have sex, try using a lubricant or moisturizer just before having intercourse. Contact a health care provider if:  Your discharge looks different than normal.  Your vagina has an unusual smell.  You have new symptoms.  Your symptoms do not improve with treatment.  Your symptoms get worse. Summary  Atrophic vaginitis is a condition in which the tissues that line the vagina become dry and thin. It is most common in women who have stopped having regular menstrual periods (are in menopause).  Treatment options include using vaginal lubricants and low-dose vaginal estrogen.  Contact a health care provider if your vagina has an unusual smell, or if your symptoms get worse or do not improve after treatment. This information is not intended to replace advice given to you by your health care provider. Make sure you discuss any questions you have with your health care provider. Document Revised: 02/26/2017 Document Reviewed: 12/10/2016 Elsevier Patient Education  2020 [employer].

## 2019-10-13 LAB — CYTOLOGY - PAP
Comment: NEGATIVE
Diagnosis: NEGATIVE
High risk HPV: NEGATIVE

## 2019-10-16 ENCOUNTER — Other Ambulatory Visit: Payer: 59

## 2019-10-16 ENCOUNTER — Other Ambulatory Visit: Payer: Self-pay

## 2019-10-16 DIAGNOSIS — Z13220 Encounter for screening for lipoid disorders: Secondary | ICD-10-CM

## 2019-10-16 DIAGNOSIS — R5383 Other fatigue: Secondary | ICD-10-CM

## 2019-10-16 DIAGNOSIS — Z1321 Encounter for screening for nutritional disorder: Secondary | ICD-10-CM

## 2019-10-16 DIAGNOSIS — Z131 Encounter for screening for diabetes mellitus: Secondary | ICD-10-CM

## 2019-10-17 LAB — VITAMIN D 25 HYDROXY (VIT D DEFICIENCY, FRACTURES): Vit D, 25-Hydroxy: 34.4 ng/mL (ref 30.0–100.0)

## 2019-10-17 LAB — LIPID PANEL
Chol/HDL Ratio: 3.5 ratio (ref 0.0–4.4)
Cholesterol, Total: 203 mg/dL — ABNORMAL HIGH (ref 100–199)
HDL: 58 mg/dL (ref >39)
LDL Chol Calc (NIH): 132 mg/dL — ABNORMAL HIGH (ref 0–99)
Triglycerides: 70 mg/dL (ref 0–149)
VLDL Cholesterol Cal: 13 mg/dL (ref 5–40)

## 2019-10-17 LAB — CBC WITH DIFFERENTIAL
Basophils Absolute: 0 10*3/uL (ref 0.0–0.2)
Basos: 1 %
EOS (ABSOLUTE): 0.3 10*3/uL (ref 0.0–0.4)
Eos: 6 %
Hematocrit: 36.2 % (ref 34.0–46.6)
Hemoglobin: 11.6 g/dL (ref 11.1–15.9)
Immature Grans (Abs): 0 10*3/uL (ref 0.0–0.1)
Immature Granulocytes: 0 %
Lymphocytes Absolute: 1.6 10*3/uL (ref 0.7–3.1)
Lymphs: 29 %
MCH: 29.6 pg (ref 26.6–33.0)
MCHC: 32 g/dL (ref 31.5–35.7)
MCV: 92 fL (ref 79–97)
Monocytes Absolute: 0.5 10*3/uL (ref 0.1–0.9)
Monocytes: 9 %
Neutrophils Absolute: 3.1 10*3/uL (ref 1.4–7.0)
Neutrophils: 55 %
RBC: 3.92 x10E6/uL (ref 3.77–5.28)
RDW: 12.6 % (ref 11.7–15.4)
WBC: 5.4 10*3/uL (ref 3.4–10.8)

## 2019-10-17 LAB — TSH: TSH: 4.49 u[IU]/mL (ref 0.450–4.500)

## 2019-10-17 LAB — GLUCOSE, FASTING: Glucose, Plasma: 87 mg/dL (ref 65–99)

## 2019-10-27 LAB — FECAL OCCULT BLOOD, IMMUNOCHEMICAL: Fecal Occult Bld: POSITIVE — AB

## 2019-10-30 ENCOUNTER — Other Ambulatory Visit: Payer: Self-pay

## 2019-10-30 ENCOUNTER — Ambulatory Visit
Admission: RE | Admit: 2019-10-30 | Discharge: 2019-10-30 | Disposition: A | Discharge status: Home / Self Care | Payer: 59 | Source: Ambulatory Visit | Attending: Obstetrics & Gynecology | Admitting: Obstetrics & Gynecology

## 2019-10-30 DIAGNOSIS — Z1231 Encounter for screening mammogram for malignant neoplasm of breast: Secondary | ICD-10-CM | POA: Diagnosis not present

## 2019-10-31 ENCOUNTER — Other Ambulatory Visit: Payer: Self-pay | Admitting: Obstetrics & Gynecology

## 2019-10-31 DIAGNOSIS — Z1211 Encounter for screening for malignant neoplasm of colon: Secondary | ICD-10-CM

## 2019-10-31 NOTE — Progress Notes (Signed)
Stool card Hemoccult POS Will repeat Colonoscopy 08/2018 normal If POS again, refer back to Dr Servando Snare   Heather Major, MD, Heather Haney Ob/Gyn, [facility] 10/31/2019  7:59 AM

## 2019-11-08 ENCOUNTER — Other Ambulatory Visit: Payer: Self-pay | Admitting: Obstetrics & Gynecology

## 2019-11-17 LAB — FECAL OCCULT BLOOD, IMMUNOCHEMICAL: Fecal Occult Bld: NEGATIVE

## 2021-01-08 ENCOUNTER — Other Ambulatory Visit: Payer: Self-pay | Admitting: Family Medicine

## 2021-01-08 DIAGNOSIS — Z1231 Encounter for screening mammogram for malignant neoplasm of breast: Secondary | ICD-10-CM

## 2021-02-19 ENCOUNTER — Other Ambulatory Visit: Payer: Self-pay

## 2021-02-19 ENCOUNTER — Ambulatory Visit
Admission: RE | Admit: 2021-02-19 | Discharge: 2021-02-19 | Disposition: A | Discharge status: Home / Self Care | Payer: BC Managed Care – PPO | Source: Ambulatory Visit | Attending: Family Medicine | Admitting: Family Medicine

## 2021-02-19 DIAGNOSIS — Z1231 Encounter for screening mammogram for malignant neoplasm of breast: Secondary | ICD-10-CM | POA: Insufficient documentation

## 2022-01-09 ENCOUNTER — Other Ambulatory Visit: Payer: Self-pay | Admitting: Family Medicine

## 2022-01-09 DIAGNOSIS — Z1231 Encounter for screening mammogram for malignant neoplasm of breast: Secondary | ICD-10-CM

## 2022-03-02 ENCOUNTER — Ambulatory Visit
Admission: RE | Admit: 2022-03-02 | Discharge: 2022-03-02 | Disposition: A | Discharge status: Home / Self Care | Payer: BC Managed Care – PPO | Source: Ambulatory Visit | Attending: Family Medicine | Admitting: Family Medicine

## 2022-03-02 DIAGNOSIS — Z1231 Encounter for screening mammogram for malignant neoplasm of breast: Secondary | ICD-10-CM | POA: Insufficient documentation

## 2022-05-13 NOTE — Progress Notes (Unsigned)
    Sofie Hartigan, MD   No chief complaint on file.   HPI:      [name] is a 52 y.o. [id number] whose LMP was No LMP recorded. (Menstrual status: Perimenopausal)., presents today for ***    Patient Active Problem List   Diagnosis Date Noted   Encounter for screening colonoscopy    IC (interstitial cystitis) 03/29/2017   Perimenopausal 03/29/2017    Past Surgical History:  Procedure Laterality Date   COLONOSCOPY WITH PROPOFOL N/A 09/26/2018   Procedure: COLONOSCOPY WITH PROPOFOL;  Surgeon: Lucilla Lame, MD;  Location: [place];  Service: Endoscopy;  Laterality: N/A;   DILATION AND CURETTAGE OF UTERUS      Family History  Problem Relation Age of Onset   Breast cancer Maternal Aunt        x2. late 92's and early 70's   Fibromyalgia Mother    Heart disease Maternal Grandmother    Heart disease Maternal Grandfather    Heart disease Paternal Grandmother    Heart disease Paternal Grandfather    Brain cancer Maternal Uncle     Social History   Socioeconomic History   Marital status: Married    Spouse name: Not on file   Number of children: Not on file   Years of education: Not on file   Highest education level: Not on file  Occupational History   Not on file  Tobacco Use   Smoking status: Never   Smokeless tobacco: Never  Vaping Use   Vaping Use: Never used  Substance and Sexual Activity   Alcohol use: No   Drug use: No   Sexual activity: Yes    Birth control/protection: None  Other Topics Concern   Not on file  Social History Narrative   Not on file   Social Determinants of Health   Financial Resource Strain: Not on file  Food Insecurity: Not on file  Transportation Needs: Not on file  Physical Activity: Not on file  Stress: Not on file  Social Connections: Not on file  Intimate Partner Violence: Not on file    Outpatient Medications Prior to Visit  Medication Sig Dispense Refill   calcium carbonate (OS-CAL) 1250 (500  Ca) MG chewable tablet Chew 1 tablet by mouth daily.     Multiple Vitamin (MULTI-VITAMINS) TABS Take by mouth.     Omega-3 Fatty Acids (FISH OIL) 1000 MG CAPS Take 1,000 mg by mouth.     vitamin B-12 (CYANOCOBALAMIN) 1000 MCG tablet Take 1,000 mcg by mouth daily.     No facility-administered medications prior to visit.      ROS:  Review of Systems BREAST: No symptoms   OBJECTIVE:   Vitals:  There were no vitals taken for this visit.  Physical Exam  Results: No results found for this or any previous visit (from the past 24 hour(s)).   Assessment/Plan: No diagnosis found.    No orders of the defined types were placed in this encounter.     No follow-ups on file.  Satoria Dunlop B. Duval Macleod, PA-C 05/13/2022 8:06 PM

## 2022-05-14 ENCOUNTER — Encounter: Payer: Self-pay | Admitting: Obstetrics and Gynecology

## 2022-05-14 ENCOUNTER — Ambulatory Visit (INDEPENDENT_AMBULATORY_CARE_PROVIDER_SITE_OTHER): Payer: BC Managed Care – PPO | Admitting: Obstetrics and Gynecology

## 2022-05-14 VITALS — BP 118/77 | HR 71 | Ht 64.0 in | Wt 122.0 lb

## 2022-05-14 DIAGNOSIS — N8110 Cystocele, unspecified: Secondary | ICD-10-CM

## 2022-06-23 NOTE — Progress Notes (Unsigned)
PCP: Sofie Hartigan, MD   No chief complaint on file.   HPI:      [name] is a 52 y.o. [id number] whose LMP was No LMP recorded. (Menstrual status: Perimenopausal)., presents today for her annual examination.  Her menses are {norm/abn:715}, lasting {number: 22536} days.  Dysmenorrhea {dysmen:716}. She {does:18564} have intermenstrual bleeding.Pt's menses irregular and infrequent due to perimenopause, lasting a few days, spotting only; although LMP was 7 days, still light flow, no dysmen  She {does:18564} have vasomotor sx.   Sex activity: {sex active: 315163}. She {does:18564} have vaginal dryness.  Baden-Walker grade 1 cystocele--sx with valsalva; add vag ERT 1 g vaginally as well as externally per urology; f/u with pelvic PT to see if she needs to do any other exercises. Can do new ref prn. Also discussed trying different kind of pessary for sx relief but maybe not as painful with removal. Pt doesn't want surgery.   Last Pap: 10/11/19 Results were: no abnormalities /neg HPV DNA.  Hx of STDs: {STD hx:14358}  Last mammogram: 03/02/22 Results were: normal--routine follow-up in 12 months There is no FH of breast cancer. There is no FH of ovarian cancer. The patient {does:18564} do self-breast exams.  Colonoscopy: 2020 with Dr. Allen Norris,  Repeat due after 10*** years.   Tobacco use: {tob:20664} Alcohol use: {Alcohol:11675} No drug use Exercise: {exercise:31265}  She {does:18564} get adequate calcium and Vitamin D in her diet.  Labs with PCP.   Patient Active Problem List   Diagnosis Date Noted   Encounter for screening colonoscopy    IC (interstitial cystitis) 03/29/2017   Perimenopausal 03/29/2017    Past Surgical History:  Procedure Laterality Date   COLONOSCOPY WITH PROPOFOL N/A 09/26/2018   Procedure: COLONOSCOPY WITH PROPOFOL;  Surgeon: Lucilla Lame, MD;  Location: [place];  Service: Endoscopy;  Laterality: N/A;   DILATION AND CURETTAGE OF UTERUS       Family History  Problem Relation Age of Onset   Breast cancer Maternal Aunt        x2. late 71's and early 70's   Fibromyalgia Mother    Heart disease Maternal Grandmother    Heart disease Maternal Grandfather    Heart disease Paternal Grandmother    Heart disease Paternal Grandfather    Brain cancer Maternal Uncle     Social History   Socioeconomic History   Marital status: Married    Spouse name: Not on file   Number of children: Not on file   Years of education: Not on file   Highest education level: Not on file  Occupational History   Not on file  Tobacco Use   Smoking status: Never   Smokeless tobacco: Never  Vaping Use   Vaping Use: Never used  Substance and Sexual Activity   Alcohol use: No   Drug use: No   Sexual activity: Yes    Birth control/protection: None  Other Topics Concern   Not on file  Social History Narrative   Not on file   Social Determinants of Health   Financial Resource Strain: Not on file  Food Insecurity: Not on file  Transportation Needs: Not on file  Physical Activity: Not on file  Stress: Not on file  Social Connections: Not on file  Intimate Partner Violence: Not on file     Current Outpatient Medications:    Multiple Vitamin (MULTI-VITAMINS) TABS, Take by mouth., Disp: , Rfl:    Omega-3 Fatty Acids (FISH OIL) 1000 MG CAPS,  Take 1,000 mg by mouth., Disp: , Rfl:    vitamin B-12 (CYANOCOBALAMIN) 1000 MCG tablet, Take 1,000 mcg by mouth daily., Disp: , Rfl:      ROS:  Review of Systems BREAST: No symptoms    Objective: There were no vitals taken for this visit.   OBGyn Exam  Results: No results found for this or any previous visit (from the past 24 hour(s)).  Assessment/Plan:  No diagnosis found.   No orders of the defined types were placed in this encounter.           GYN counsel {counseling: 16159}    F/U  No follow-ups on file.  Dalayna Lauter B. Phillipa Morden, PA-C 06/23/2022 1:49 PM

## 2022-06-25 ENCOUNTER — Ambulatory Visit (INDEPENDENT_AMBULATORY_CARE_PROVIDER_SITE_OTHER): Payer: BC Managed Care – PPO | Admitting: Obstetrics and Gynecology

## 2022-06-25 ENCOUNTER — Encounter: Payer: Self-pay | Admitting: Obstetrics and Gynecology

## 2022-06-25 VITALS — BP 104/70 | Ht 64.0 in | Wt 127.0 lb

## 2022-06-25 DIAGNOSIS — N951 Menopausal and female climacteric states: Secondary | ICD-10-CM

## 2022-06-25 DIAGNOSIS — Z01419 Encounter for gynecological examination (general) (routine) without abnormal findings: Secondary | ICD-10-CM | POA: Diagnosis not present

## 2022-06-25 DIAGNOSIS — Z1211 Encounter for screening for malignant neoplasm of colon: Secondary | ICD-10-CM

## 2022-06-25 DIAGNOSIS — N8110 Cystocele, unspecified: Secondary | ICD-10-CM

## 2022-06-25 DIAGNOSIS — Z1231 Encounter for screening mammogram for malignant neoplasm of breast: Secondary | ICD-10-CM

## 2022-06-25 NOTE — Patient Instructions (Addendum)
I value your feedback and you entrusting us with your care. If you get a [place] patient survey, I would appreciate you taking the time to let us know about your experience today. Thank you!  [facility]: [phone]      

## 2023-05-11 ENCOUNTER — Ambulatory Visit: Payer: BC Managed Care – PPO

## 2023-05-19 ENCOUNTER — Ambulatory Visit
Admission: RE | Admit: 2023-05-19 | Discharge: 2023-05-19 | Disposition: A | Discharge status: Home / Self Care | Payer: 59 | Source: Ambulatory Visit | Attending: Obstetrics and Gynecology | Admitting: Obstetrics and Gynecology

## 2023-05-19 DIAGNOSIS — Z1231 Encounter for screening mammogram for malignant neoplasm of breast: Secondary | ICD-10-CM | POA: Insufficient documentation

## 2023-05-25 ENCOUNTER — Encounter: Payer: Self-pay | Admitting: Obstetrics and Gynecology

## 2023-07-31 IMAGING — MG MM DIGITAL SCREENING BILAT W/ TOMO AND CAD
8 series · 9 of 24 positions shown · non-contrast
Comparison: Previous exam(s).

CLINICAL DATA: Screening.

EXAM:
DIGITAL SCREENING BILATERAL MAMMOGRAM WITH TOMOSYNTHESIS AND CAD
TECHNIQUE: Bilateral screening digital craniocaudal and mediolateral oblique
mammograms were obtained. Bilateral screening digital breast
tomosynthesis was performed. The images were evaluated with
computer-aided detection.

[R CC synth-2D]
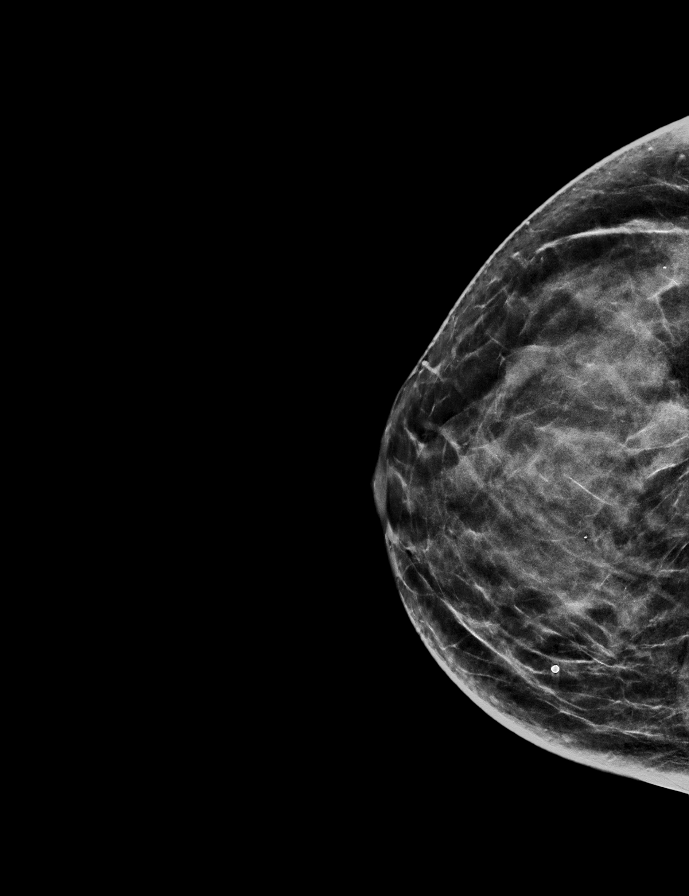

[L CC synth-2D]
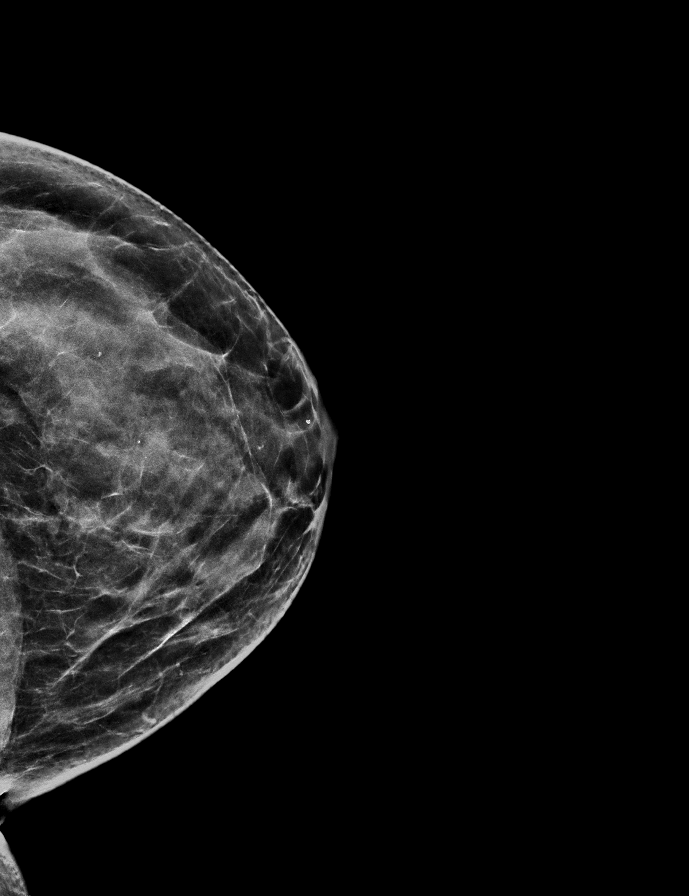

[L MLO synth-2D]
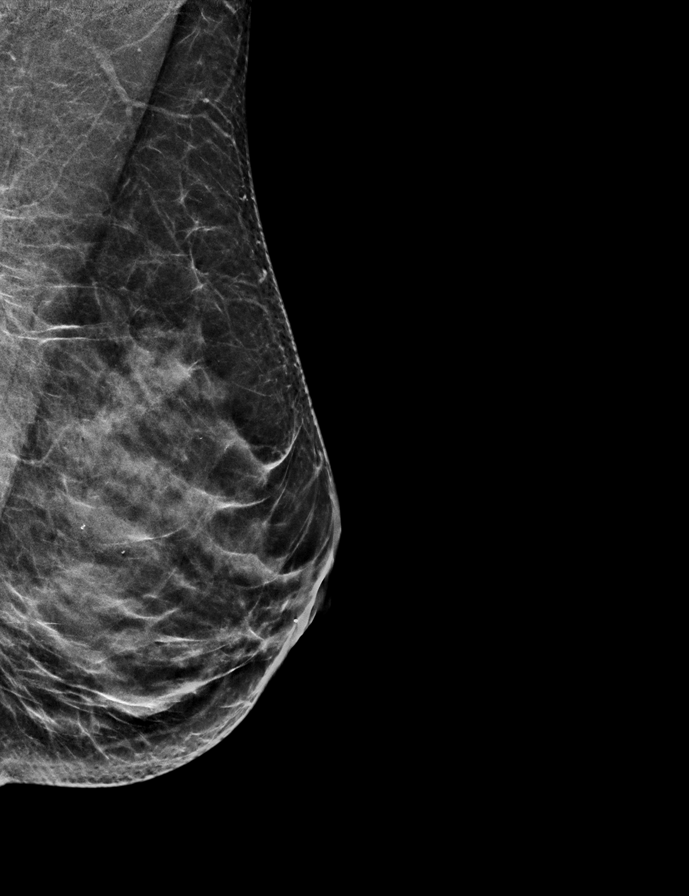

[R MLO synth-2D]
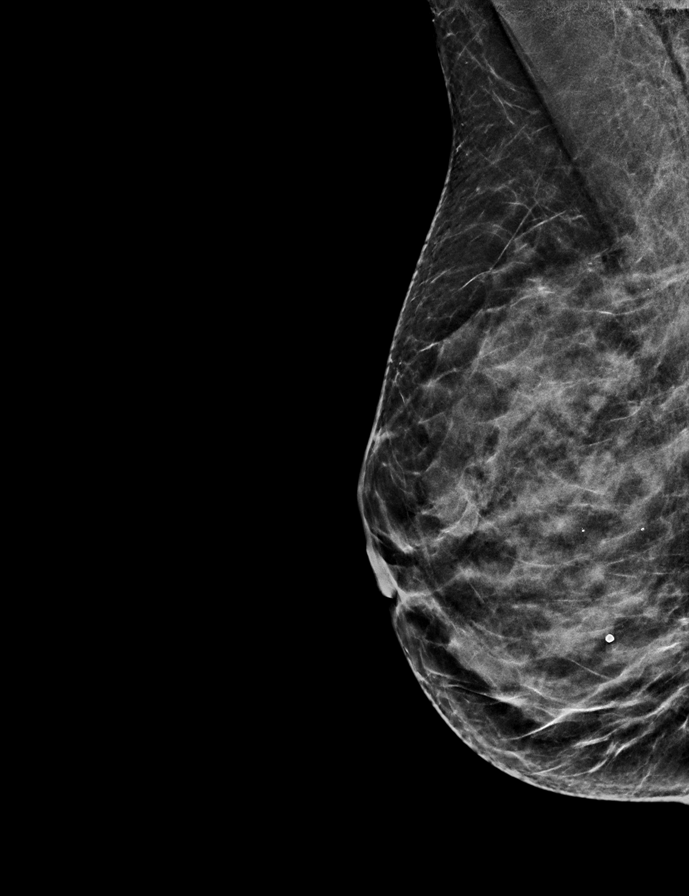

[R CC tomo · 2 of 57 frames shown]
[frame 19/57]
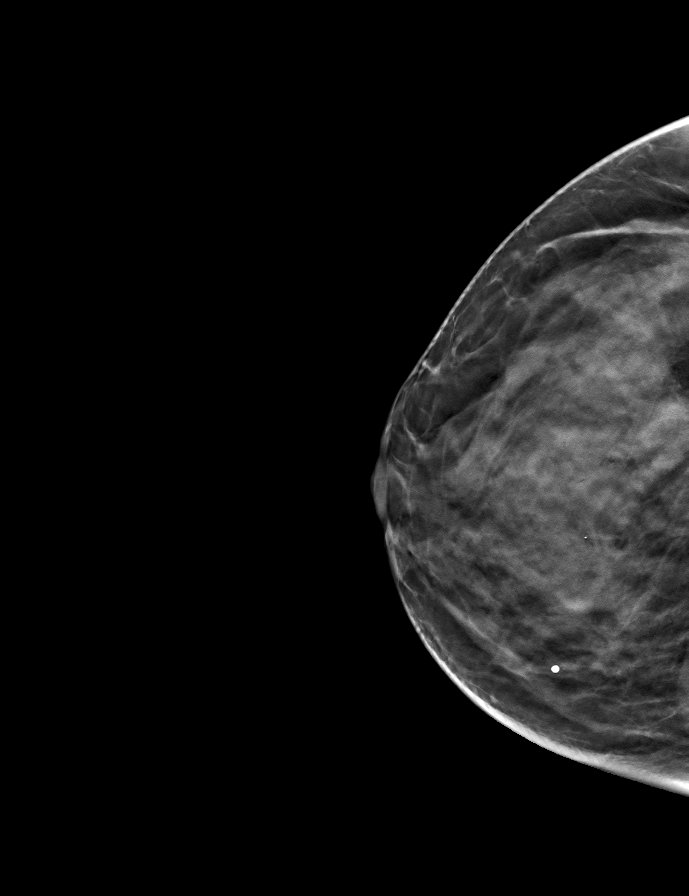
[frame 29/57]
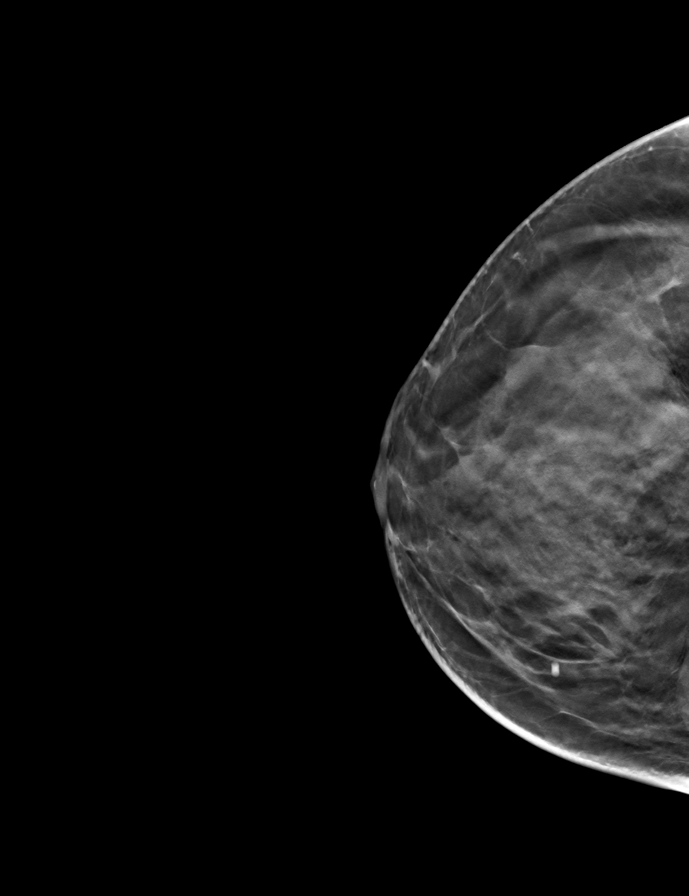

[L MLO tomo · tomo slice 27/53.0]
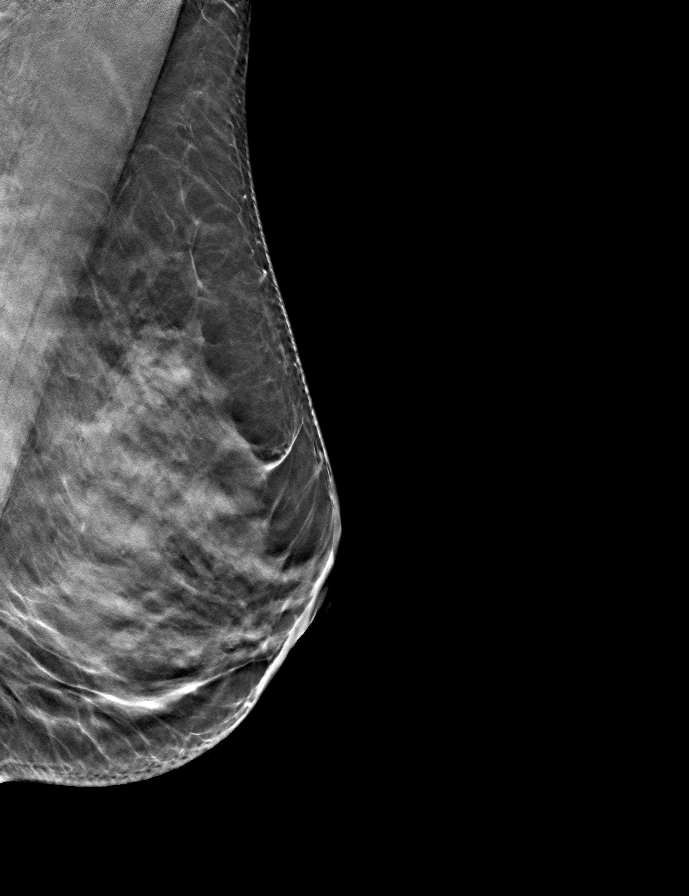

[L CC tomo · tomo slice 29/56.0]
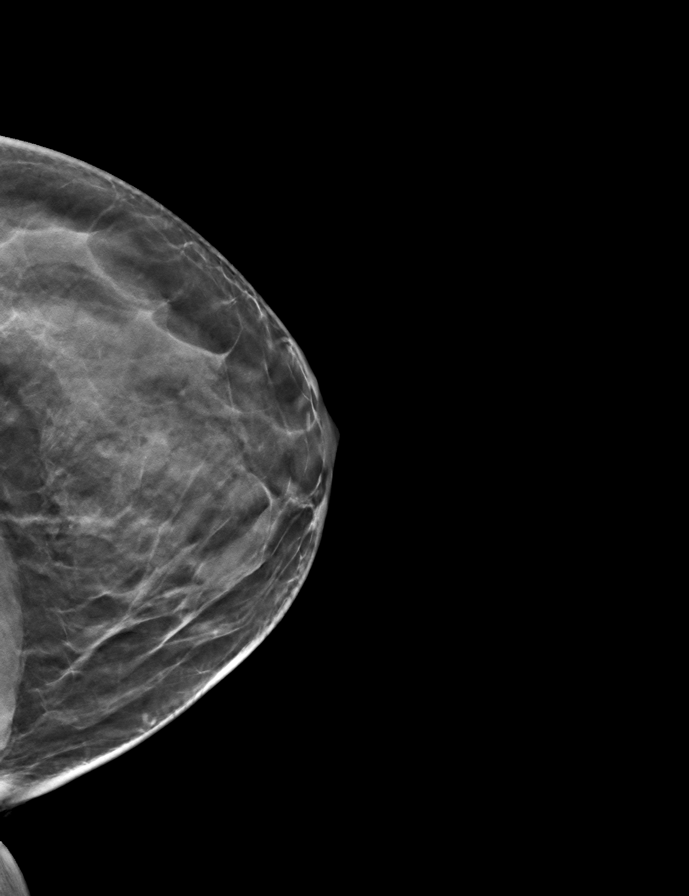

[R MLO tomo · tomo slice 27/53.0]
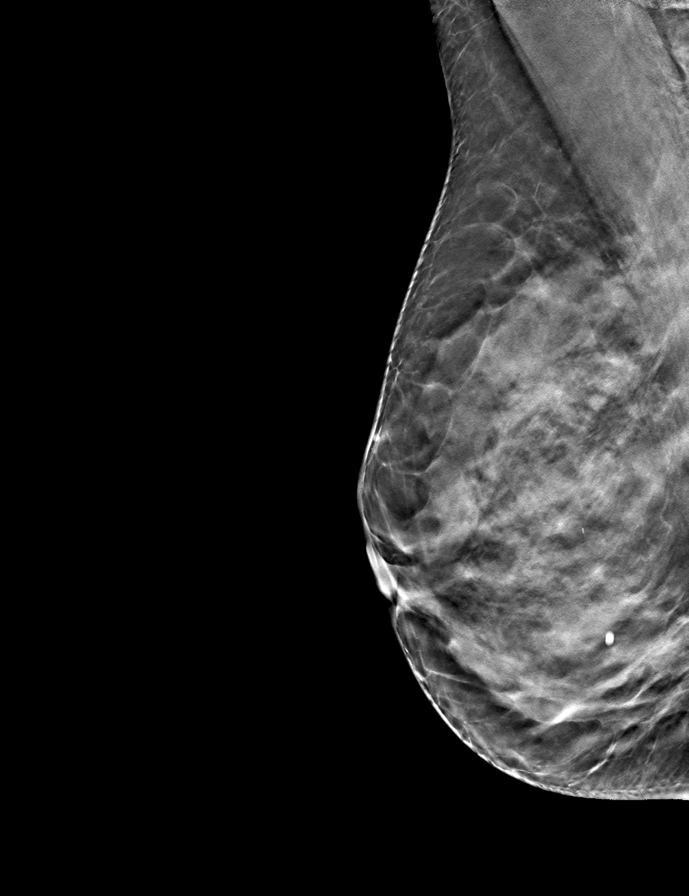

[9 of 24 positions shown; findings below may reference images not displayed]

ACR Breast Density Category c: The breast tissue is heterogeneously
dense, which may obscure small masses.
FINDINGS: There are no findings suspicious for malignancy.
IMPRESSION: No mammographic evidence of malignancy. A result letter of this
screening mammogram will be mailed directly to the patient.

RECOMMENDATION:
Screening mammogram in one year. (Code:Q3-W-BC3)

BI-RADS CATEGORY  1: Negative.
